# Patient Record
Sex: Female | Born: 1965 | Race: Black or African American | Hispanic: No | Marital: Married | Smoking: Never smoker
Health system: Southern US, Community
[De-identification: ages and names within clinical notes are randomized; demographics above are authoritative.]

## PROBLEM LIST (undated history)

## (undated) DIAGNOSIS — E669 Obesity, unspecified: Secondary | ICD-10-CM

## (undated) DIAGNOSIS — T7840XA Allergy, unspecified, initial encounter: Secondary | ICD-10-CM

## (undated) HISTORY — DX: Obesity, unspecified: E66.9

## (undated) HISTORY — PX: NO PAST SURGERIES: SHX2092

## (undated) HISTORY — DX: Allergy, unspecified, initial encounter: T78.40XA

---

## 1999-08-02 ENCOUNTER — Other Ambulatory Visit: Admission: RE | Admit: 1999-08-02 | Discharge: 1999-08-02 | Payer: Self-pay | Admitting: Obstetrics & Gynecology

## 2000-08-15 ENCOUNTER — Other Ambulatory Visit: Admission: RE | Admit: 2000-08-15 | Discharge: 2000-08-15 | Payer: Self-pay | Admitting: Obstetrics & Gynecology

## 2001-07-11 ENCOUNTER — Other Ambulatory Visit: Admission: RE | Admit: 2001-07-11 | Discharge: 2001-07-11 | Payer: Self-pay | Admitting: Obstetrics and Gynecology

## 2001-08-06 ENCOUNTER — Encounter: Payer: Self-pay | Admitting: Obstetrics and Gynecology

## 2001-08-06 ENCOUNTER — Ambulatory Visit (HOSPITAL_COMMUNITY): Admission: RE | Admit: 2001-08-06 | Discharge: 2001-08-06 | Payer: Self-pay | Admitting: Obstetrics and Gynecology

## 2002-07-15 ENCOUNTER — Other Ambulatory Visit: Admission: RE | Admit: 2002-07-15 | Discharge: 2002-07-15 | Payer: Self-pay | Admitting: Obstetrics and Gynecology

## 2003-07-21 ENCOUNTER — Other Ambulatory Visit: Admission: RE | Admit: 2003-07-21 | Discharge: 2003-07-21 | Payer: Self-pay | Admitting: Obstetrics and Gynecology

## 2003-08-21 ENCOUNTER — Encounter: Payer: Self-pay | Admitting: Obstetrics and Gynecology

## 2003-08-21 ENCOUNTER — Ambulatory Visit (HOSPITAL_COMMUNITY): Admission: RE | Admit: 2003-08-21 | Discharge: 2003-08-21 | Payer: Self-pay | Admitting: Obstetrics and Gynecology

## 2003-08-24 ENCOUNTER — Ambulatory Visit (HOSPITAL_COMMUNITY): Admission: RE | Admit: 2003-08-24 | Discharge: 2003-08-24 | Payer: Self-pay | Admitting: Obstetrics and Gynecology

## 2003-08-24 ENCOUNTER — Encounter: Payer: Self-pay | Admitting: Obstetrics and Gynecology

## 2004-07-27 ENCOUNTER — Other Ambulatory Visit: Admission: RE | Admit: 2004-07-27 | Discharge: 2004-07-27 | Payer: Self-pay | Admitting: Obstetrics and Gynecology

## 2004-09-15 ENCOUNTER — Ambulatory Visit (HOSPITAL_COMMUNITY): Admission: RE | Admit: 2004-09-15 | Discharge: 2004-09-15 | Payer: Self-pay | Admitting: Obstetrics and Gynecology

## 2004-10-13 ENCOUNTER — Ambulatory Visit (HOSPITAL_COMMUNITY): Admission: RE | Admit: 2004-10-13 | Discharge: 2004-10-13 | Payer: Self-pay | Admitting: Obstetrics and Gynecology

## 2004-12-08 ENCOUNTER — Ambulatory Visit (HOSPITAL_COMMUNITY): Admission: RE | Admit: 2004-12-08 | Discharge: 2004-12-08 | Payer: Self-pay | Admitting: Obstetrics and Gynecology

## 2005-02-08 ENCOUNTER — Inpatient Hospital Stay (HOSPITAL_COMMUNITY): Admission: AD | Admit: 2005-02-08 | Discharge: 2005-02-10 | Payer: Self-pay | Admitting: Obstetrics and Gynecology

## 2005-07-13 ENCOUNTER — Other Ambulatory Visit: Admission: RE | Admit: 2005-07-13 | Discharge: 2005-07-13 | Payer: Self-pay | Admitting: Obstetrics and Gynecology

## 2006-05-23 ENCOUNTER — Other Ambulatory Visit: Admission: RE | Admit: 2006-05-23 | Discharge: 2006-05-23 | Payer: Self-pay | Admitting: Obstetrics and Gynecology

## 2008-08-17 ENCOUNTER — Encounter: Admission: RE | Admit: 2008-08-17 | Discharge: 2008-08-17 | Payer: Self-pay | Admitting: Obstetrics and Gynecology

## 2009-09-07 ENCOUNTER — Encounter: Admission: RE | Admit: 2009-09-07 | Discharge: 2009-09-07 | Payer: Self-pay | Admitting: Obstetrics and Gynecology

## 2010-12-24 ENCOUNTER — Encounter: Payer: Self-pay | Admitting: Obstetrics and Gynecology

## 2011-03-16 ENCOUNTER — Encounter: Payer: Self-pay | Admitting: Family Medicine

## 2011-03-16 ENCOUNTER — Ambulatory Visit (INDEPENDENT_AMBULATORY_CARE_PROVIDER_SITE_OTHER): Payer: 59 | Admitting: Family Medicine

## 2011-03-16 VITALS — BP 122/80 | HR 76 | Temp 98.4°F | Ht 63.5 in | Wt 262.4 lb

## 2011-03-16 DIAGNOSIS — Z136 Encounter for screening for cardiovascular disorders: Secondary | ICD-10-CM

## 2011-03-16 DIAGNOSIS — Z0000 Encounter for general adult medical examination without abnormal findings: Secondary | ICD-10-CM | POA: Insufficient documentation

## 2011-03-16 DIAGNOSIS — R079 Chest pain, unspecified: Secondary | ICD-10-CM | POA: Insufficient documentation

## 2011-03-16 LAB — CBC WITH DIFFERENTIAL/PLATELET
Basophils Absolute: 0.1 10*3/uL (ref 0.0–0.1)
Basophils Relative: 0.5 % (ref 0.0–3.0)
Eosinophils Absolute: 0.4 10*3/uL (ref 0.0–0.7)
Eosinophils Relative: 3.6 % (ref 0.0–5.0)
HCT: 38 % (ref 36.0–46.0)
Hemoglobin: 12.6 g/dL (ref 12.0–15.0)
Lymphocytes Relative: 20.7 % (ref 12.0–46.0)
Lymphs Abs: 2.2 10*3/uL (ref 0.7–4.0)
MCHC: 33.2 g/dL (ref 30.0–36.0)
MCV: 83.9 fl (ref 78.0–100.0)
Monocytes Absolute: 1.1 10*3/uL — ABNORMAL HIGH (ref 0.1–1.0)
Monocytes Relative: 10.3 % (ref 3.0–12.0)
Neutro Abs: 6.9 10*3/uL (ref 1.4–7.7)
Neutrophils Relative %: 64.9 % (ref 43.0–77.0)
Platelets: 318 10*3/uL (ref 150.0–400.0)
RBC: 4.53 Mil/uL (ref 3.87–5.11)
RDW: 14.4 % (ref 11.5–14.6)
WBC: 10.6 10*3/uL — ABNORMAL HIGH (ref 4.5–10.5)

## 2011-03-16 LAB — LIPID PANEL
Cholesterol: 189 mg/dL (ref 0–200)
HDL: 36 mg/dL — ABNORMAL LOW (ref >39.00)
LDL Cholesterol: 138 mg/dL — ABNORMAL HIGH (ref 0–99)
Total CHOL/HDL Ratio: 5
Triglycerides: 77 mg/dL (ref 0.0–149.0)
VLDL: 15.4 mg/dL (ref 0.0–40.0)

## 2011-03-16 LAB — BASIC METABOLIC PANEL
BUN: 9 mg/dL (ref 6–23)
CO2: 28 mEq/L (ref 19–32)
Calcium: 9 mg/dL (ref 8.4–10.5)
Chloride: 100 mEq/L (ref 96–112)
Creatinine, Ser: 0.8 mg/dL (ref 0.4–1.2)
GFR: 104.83 mL/min (ref >60.00)
Glucose, Bld: 85 mg/dL (ref 70–99)
Potassium: 4.1 mEq/L (ref 3.5–5.1)
Sodium: 135 mEq/L (ref 135–145)

## 2011-03-16 LAB — TSH: TSH: 2.43 u[IU]/mL (ref 0.35–5.50)

## 2011-03-16 NOTE — Progress Notes (Signed)
45 yo here to establish care and for CPX.  G1P1, has OBGYN and uptodate on prevention (pap and mammogram). Has an mirena IUD.  Left sided chest pain.  On and off for several months.  Feels like a "twinge."  Not reproducible with palpation but is relieved when she wears more supportive bras.  Pain can occur at rest or with exertion but seems to feel it more frequently when standing. No associated palpitations, nausea, diaphoresis. No fatigue. No SOB.  The PMH, PSH, Social History, Family History, Medications, and allergies have been reviewed in [facility], and have been updated if relevant.  ROS: See HPI General: Denies fever, chills, sweats. No significant weight loss. Eyes: Denies blurring,significant itching ENT: Denies earache, sore throat, and hoarseness.  Cardiovascular: Denies palpitations, dyspnea on exertion,  Respiratory: Denies cough, dyspnea at rest,wheeezing Breast: no concerns about lumps GI: Denies nausea, vomiting, diarrhea, constipation, change in bowel habits, abdominal pain, melena, hematochezia GU: Denies dysuria, hematuria, urinary hesitancy, nocturia, denies STD risk, no concerns about discharge Musculoskeletal: Denies back pain, joint pain Derm: Denies rash, itching Neuro: Denies  paresthesias, frequent falls, frequent headaches Psych: Denies depression, anxiety Endocrine: Denies cold intolerance, heat intolerance, polydipsia Heme: Denies enlarged lymph nodes  Physical exam: BP 122/80  Pulse 76  Temp(Src) 98.4 F (36.9 C) (Oral)  Ht 5' 3.5" (1.613 m)  Wt 262 lb 6.4 oz (119.024 kg)  BMI 45.75 kg/m2  General:  Pleasant, overweight female,in no acute distress; alert,appropriate and cooperative throughout examination Head:  normocephalic and atraumatic.   Eyes:  vision grossly intact, pupils equal, pupils round, and pupils reactive to light.   Ears:  R ear normal and L ear normal.   Nose:  no external deformity.   Mouth:  good dentition.   Neck:  No deformities,  masses, or tenderness noted. Breasts:  No mass, nodules, thickening, tenderness, bulging, retraction, inflamation, nipple discharge or skin changes noted.   Lungs:  Normal respiratory effort, chest expands symmetrically. Lungs are clear to auscultation, no crackles or wheezes. Heart:  Normal rate and regular rhythm. S1 and S2 normal without gallop, murmur, click, rub or other extra sounds. Abdomen:  Bowel sounds positive,abdomen soft and non-tender without masses, organomegaly or hernias noted. Msk:  No deformity or scoliosis noted of thoracic or lumbar spine.   CP not reproducible Extremities:  No clubbing, cyanosis, edema, or deformity noted with normal full range of motion of all joints.   Neurologic:  alert & oriented X3 and gait normal.   Skin:  Intact without suspicious lesions or rashes Cervical Nodes:  No lymphadenopathy noted Axillary Nodes:  No palpable lymphadenopathy Psych:  Cognition and judgment appear intact. Alert and cooperative with normal attention span and concentration. No apparent delusions, illusions, hallucinations

## 2011-03-16 NOTE — Assessment & Plan Note (Signed)
Likely MSK.  Does not seem cardiac, no family history of premature CAD. EKG showed low voltage,otherwise NSR. CBC, TSH today. Will refer for stress test if symptoms progress. The patient indicates understanding of these issues and agrees with the plan.

## 2011-03-16 NOTE — Assessment & Plan Note (Signed)
Reviewed preventive care protocols, scheduled due services, and updated immunizations Discussed nutrition, exercise, diet, and healthy lifestyle.  FLP, BMET today.

## 2011-03-16 NOTE — Progress Notes (Signed)
Addended by: Ruthe Mannan on: 03/16/2011 01:16 PM   Modules accepted: Level of Service

## 2011-04-21 NOTE — H&P (Signed)
NAME:  BAYLEI, SIEBELS NO.:  [id number]   MEDICAL RECORD NO.:  [id number]          PATIENT TYPE:  INP   LOCATION:  9160                          FACILITY:  [facility]   PHYSICIAN:  Janine Limbo, M.D.DATE OF BIRTH:  [birth year]   DATE OF ADMISSION:  02/08/2005  DATE OF DISCHARGE:                                HISTORY & PHYSICAL   Ms. Broberg is a 45 year old, married, black female, gravida 2, para 0-0-1-0,  at 38/6/7th's weeks who presents complaining of leaking fluid since about  11:30 this morning.  She was evaluated earlier today at the office and her  exam for ruling out ruptured membranes was equivocal where upon she was sent  to Maternity Admissions for further evaluation.  She was also noted to have  some blood pressure elevation of 130s over 80s at the office and also had a  PIH labs collected in Maternity Admissions.  Those are all normal.  She had  several large gushes of clear amniotic fluid in Maternity Admissions to  confirm the diagnosis of ruptured membranes.  She also subsequently  increased in uterine contraction frequency and strength and is now  complaining of contractions every two to three minutes and rating them as 7  to 8 out of 10.  She also reports positive fetal movement.  She denies any  nausea, vomiting, headaches, or visual disturbances.   Her pregnancy has been followed at [facility]  by the Certified  Nurse Midwife Service and has been:  1.  Essentially uncomplicated, though at risk for a history of advanced      maternal age, declining amnio.  2.  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  3.  A history of laser surgery on her cervix.   Her Group B strep is negative.   OBSTETRICAL AND GYNECOLOGICAL HISTORY:  1.  She is gravida 2, para 0-0-1-0, with an elective AB back in 1990 without      complication.  2.  Her LMP for this pregnancy was May 13, 2004, giving her an [facility] of February 17, 2005, confirmed by early ultrasound.  3.  Abnormal Pap, treated with laser surgery in 1991, and her Pap smears      have been normal since then.  4.  She also had an HSG that was diagnosed.  5.  Her right fallopian tube was blocked.   GENERAL MEDICAL HISTORY:  1.  She has no known drug allergies.  2.  She reports having had the usual childhood diseases.  3.  She has no other medical issues.  4.  She has an occasional urinary tract infection.  5.  Her only surgery was wisdom teeth removed at age 57.   FAMILY HISTORY:  Significant for an uncle with an MI.  Mom and aunts have  hypertension.  Aunt with varicose veins.  An aunt that died from  tuberculosis.  Mother with borderline diabetes.  Grandfather with epilepsy  and father with lung cancer.   GENETIC HISTORY:  Significant only for the  fact that she is over age 3 and  declined an amniocentesis.   SOCIAL HISTORY:  She is married to [employer] who is involved and  supportive.  They are both employed full time.  They deny any illicit drug  use, alcohol, or smoking with this pregnancy.   PRENATAL LABS:  Her blood type is O positive.  Her antibody screen is  negative.  Sickle cell trait is not currently known.  Her syphilis is  immune.  Rubella was not listed.  Hepatitis was negative.  HIV is  nonreactive.  Cystic fibrosis is negative.  Her 36-week beta strep was  negative.  Gonorrhea and Chlamydia were declined.  Her one hour Glucola was  118.   PHYSICAL EXAMINATION:  VITAL SIGNS:  In the 130s over 70s to 80s.  She is  afebrile.  HEENT:  Grossly within normal limits.  HEART:  Regular rhythm and rate.  BREASTS:  Soft and nontender.  ABDOMEN:  Gravid with uterine contractions every two to three minutes.  Her  fetal heart rate is reactive and reassuring with a baseline between 110 and  120.  PELVIC:  Her cervix is 4-cm, 100%, vertex, -1 to -2, and clear fluid noted.  EXTREMITIES:  Within normal limits.   ASSESSMENT:  1.   Intrauterine pregnancy at 38-6/7th's weeks.  2.  Spontaneous rupture of membranes with clear fluid at 11:30 a.m.  3.  Early active labor.   PLAN:  1.  Admit to labor and delivery.  2.  Follow routine CNM orders.  3.  Dr. Kirkland Hun is notified of the patient's admission.      SJD/MEDQ  D:  02/08/2005  T:  02/08/2005  Job:  [id number]

## 2011-10-16 ENCOUNTER — Telehealth: Payer: Self-pay | Admitting: Family Medicine

## 2011-10-16 NOTE — Telephone Encounter (Signed)
Triage Record Num: [id number] Operator: Griselda Miner Patient Name: Tracie Joyce Call Date & Time: 10/14/2011 10:56:27AM Patient Phone: [phone] PCP: Ruthe Mannan Patient Gender: Female PCP Fax : [phone] Patient DOB: 1966/07/26 Practice Name: [facility] Reason for Call: Pt calls with complaints of persistent cough of 4 to f days-- onset 10/09/11. Cough intensity has increased since 10/12/11. Patient has some cold symptoms nose running --clear, eyes are puffy, nose stuffy, some body ached that patient feels is related to the coughing. Started with tickle and now feels like in chest but cough is non-productive. Afebrile but did note low grade oral temp of 99 on 10/10/11. Does have a Mirena IUD-not breastfeeding. Dispostion: Home/Self Care advice given. Appt Scheduled: NO Protocol(s) Used: Cough - Adult Recommended Outcome per Protocol: Provide Home/Self Care Reason for Outcome: New onset of two or more of the following symptoms: nasal congestion with runny nose; sneezing; itchy or mild sore throat; mild headache or body aches; mild fatigue; low grade fever up to 101.5 F (38.6C) usually lasting about a week Care Advice: ~ Use a cool mist humidifier to moisten air. Be sure to clean according to manufacturer's instructions. ~ Rest until symptoms improve. Handwashing and avoiding finger-to-nose, finger-to-mouth, or finger-to-eye contact after exposure may reduce risk of transmission. ~ Call provider if difficulty breathing or wheezing develops or if cough becomes productive of green, yellow or brown sputum ~ During pregnancy, call provider if temperature is 100 F (37.7 C) or greater OR any temperature elevation for 3 days even while taking acetaminophen. ~ For a fever during pregnancy, take temperature every 4 hours while awake, or more often with chills/sweats, until temperature returns to normal for 24 hours. ~ ~ Consider use of a saline nasal spray per package directions  to help relieve nasal congestion. An annual influenza vaccination is recommended for all adults 63 years of age or older, those with chronic illness, or in contact with high risk individuals. An immunization for pneumonia is also recommended. The frail elderly may require a second pneumonia immunization 3 to 5 years after the first dose. ~ ~ Cover mouth and nose tightly with tissue when sneezing or coughing, or cough into your elbow. Over-the-counter cough medicines are not recommended to treat a cough. An antihistamine in combination with a decongestant may help relieve a postnasal drip that causes the cough. Check all ingredients before taking cold remedies as many are combinations and when taken with another single ingredient medicine may mean you are overdosing with that medicine. If you have high blood pressure, consult pharmacist or provider before taking a decongestant alone or in combination with another drug. ~ ~ Call provider for evaluation of cough that lasts 2 weeks or more. ~ HEALTH PROMOTION / MAINTENANCE ~ SYMPTOM / CONDITION MANAGEMENT ~ INFECTION CONTROL ~ CAUTIONS ~ Analgesic/Antipyretic Advice - Acetaminophen:

## 2011-12-27 ENCOUNTER — Other Ambulatory Visit: Payer: Self-pay | Admitting: Obstetrics and Gynecology

## 2011-12-27 DIAGNOSIS — Z1231 Encounter for screening mammogram for malignant neoplasm of breast: Secondary | ICD-10-CM

## 2012-01-06 ENCOUNTER — Encounter: Payer: Self-pay | Admitting: Obstetrics and Gynecology

## 2012-01-11 ENCOUNTER — Ambulatory Visit
Admission: RE | Admit: 2012-01-11 | Discharge: 2012-01-11 | Disposition: A | Discharge status: Home / Self Care | Payer: 59 | Source: Ambulatory Visit | Attending: Obstetrics and Gynecology | Admitting: Obstetrics and Gynecology

## 2012-01-11 DIAGNOSIS — Z1231 Encounter for screening mammogram for malignant neoplasm of breast: Secondary | ICD-10-CM

## 2012-01-15 ENCOUNTER — Encounter: Payer: Self-pay | Admitting: *Deleted

## 2013-02-12 ENCOUNTER — Other Ambulatory Visit: Payer: Self-pay

## 2013-02-27 ENCOUNTER — Other Ambulatory Visit: Payer: Self-pay | Admitting: Obstetrics and Gynecology

## 2013-02-28 LAB — PAP IG W/ RFLX HPV ASCU

## 2013-03-19 ENCOUNTER — Ambulatory Visit
Admission: RE | Admit: 2013-03-19 | Discharge: 2013-03-19 | Disposition: A | Discharge status: Home / Self Care | Payer: 59 | Source: Ambulatory Visit

## 2013-03-19 DIAGNOSIS — Z1231 Encounter for screening mammogram for malignant neoplasm of breast: Secondary | ICD-10-CM

## 2013-03-24 ENCOUNTER — Other Ambulatory Visit: Payer: Self-pay | Admitting: Obstetrics and Gynecology

## 2013-03-24 DIAGNOSIS — R928 Other abnormal and inconclusive findings on diagnostic imaging of breast: Secondary | ICD-10-CM

## 2013-03-31 ENCOUNTER — Ambulatory Visit
Admission: RE | Admit: 2013-03-31 | Discharge: 2013-03-31 | Disposition: A | Discharge status: Home / Self Care | Payer: 59 | Source: Ambulatory Visit | Attending: Obstetrics and Gynecology | Admitting: Obstetrics and Gynecology

## 2013-03-31 DIAGNOSIS — R928 Other abnormal and inconclusive findings on diagnostic imaging of breast: Secondary | ICD-10-CM

## 2014-02-17 ENCOUNTER — Other Ambulatory Visit: Payer: Self-pay | Admitting: Obstetrics and Gynecology

## 2014-02-17 DIAGNOSIS — N63 Unspecified lump in unspecified breast: Secondary | ICD-10-CM

## 2014-04-01 ENCOUNTER — Ambulatory Visit
Admission: RE | Admit: 2014-04-01 | Discharge: 2014-04-01 | Disposition: A | Discharge status: Home / Self Care | Payer: 59 | Source: Ambulatory Visit | Attending: Obstetrics and Gynecology | Admitting: Obstetrics and Gynecology

## 2014-04-01 DIAGNOSIS — N63 Unspecified lump in unspecified breast: Secondary | ICD-10-CM

## 2015-04-12 ENCOUNTER — Other Ambulatory Visit: Payer: Self-pay | Admitting: Obstetrics and Gynecology

## 2015-04-12 DIAGNOSIS — N63 Unspecified lump in unspecified breast: Secondary | ICD-10-CM

## 2015-04-15 ENCOUNTER — Ambulatory Visit
Admission: RE | Admit: 2015-04-15 | Discharge: 2015-04-15 | Disposition: A | Discharge status: Home / Self Care | Payer: 59 | Source: Ambulatory Visit | Attending: Obstetrics and Gynecology | Admitting: Obstetrics and Gynecology

## 2015-04-15 DIAGNOSIS — N63 Unspecified lump in unspecified breast: Secondary | ICD-10-CM

## 2016-06-14 ENCOUNTER — Other Ambulatory Visit: Payer: Self-pay | Admitting: Obstetrics and Gynecology

## 2016-06-14 DIAGNOSIS — Z1231 Encounter for screening mammogram for malignant neoplasm of breast: Secondary | ICD-10-CM

## 2016-06-21 ENCOUNTER — Ambulatory Visit
Admission: RE | Admit: 2016-06-21 | Discharge: 2016-06-21 | Disposition: A | Discharge status: Home / Self Care | Payer: 59 | Source: Ambulatory Visit | Attending: Obstetrics and Gynecology | Admitting: Obstetrics and Gynecology

## 2016-06-21 DIAGNOSIS — Z1231 Encounter for screening mammogram for malignant neoplasm of breast: Secondary | ICD-10-CM

## 2017-10-17 ENCOUNTER — Other Ambulatory Visit: Payer: Self-pay | Admitting: Obstetrics and Gynecology

## 2017-10-17 DIAGNOSIS — Z139 Encounter for screening, unspecified: Secondary | ICD-10-CM

## 2017-11-14 ENCOUNTER — Ambulatory Visit
Admission: RE | Admit: 2017-11-14 | Discharge: 2017-11-14 | Disposition: A | Discharge status: Home / Self Care | Payer: 59 | Source: Ambulatory Visit | Attending: Obstetrics and Gynecology | Admitting: Obstetrics and Gynecology

## 2017-11-14 DIAGNOSIS — Z139 Encounter for screening, unspecified: Secondary | ICD-10-CM

## 2017-11-15 ENCOUNTER — Other Ambulatory Visit: Payer: Self-pay | Admitting: Obstetrics and Gynecology

## 2017-11-15 DIAGNOSIS — R928 Other abnormal and inconclusive findings on diagnostic imaging of breast: Secondary | ICD-10-CM

## 2017-11-20 ENCOUNTER — Other Ambulatory Visit: Payer: Self-pay | Admitting: Obstetrics and Gynecology

## 2017-11-20 ENCOUNTER — Ambulatory Visit
Admission: RE | Admit: 2017-11-20 | Discharge: 2017-11-20 | Disposition: A | Discharge status: Home / Self Care | Payer: 59 | Source: Ambulatory Visit | Attending: Obstetrics and Gynecology | Admitting: Obstetrics and Gynecology

## 2017-11-20 DIAGNOSIS — R921 Mammographic calcification found on diagnostic imaging of breast: Secondary | ICD-10-CM

## 2017-11-20 DIAGNOSIS — R928 Other abnormal and inconclusive findings on diagnostic imaging of breast: Secondary | ICD-10-CM

## 2017-11-21 ENCOUNTER — Other Ambulatory Visit: Payer: Self-pay | Admitting: Obstetrics and Gynecology

## 2017-11-21 DIAGNOSIS — R921 Mammographic calcification found on diagnostic imaging of breast: Secondary | ICD-10-CM

## 2017-11-21 DIAGNOSIS — N63 Unspecified lump in unspecified breast: Secondary | ICD-10-CM

## 2018-05-28 ENCOUNTER — Ambulatory Visit
Admission: RE | Admit: 2018-05-28 | Discharge: 2018-05-28 | Disposition: A | Discharge status: Home / Self Care | Payer: 59 | Source: Ambulatory Visit | Attending: Obstetrics and Gynecology | Admitting: Obstetrics and Gynecology

## 2018-05-28 ENCOUNTER — Other Ambulatory Visit: Payer: Self-pay | Admitting: Obstetrics and Gynecology

## 2018-05-28 DIAGNOSIS — N63 Unspecified lump in unspecified breast: Secondary | ICD-10-CM

## 2018-05-28 DIAGNOSIS — R921 Mammographic calcification found on diagnostic imaging of breast: Secondary | ICD-10-CM

## 2018-12-09 ENCOUNTER — Other Ambulatory Visit: Payer: 59

## 2018-12-16 ENCOUNTER — Ambulatory Visit
Admission: RE | Admit: 2018-12-16 | Discharge: 2018-12-16 | Disposition: A | Discharge status: Home / Self Care | Payer: PRIVATE HEALTH INSURANCE | Source: Ambulatory Visit | Attending: Obstetrics and Gynecology | Admitting: Obstetrics and Gynecology

## 2018-12-16 DIAGNOSIS — N63 Unspecified lump in unspecified breast: Secondary | ICD-10-CM

## 2019-12-31 ENCOUNTER — Other Ambulatory Visit: Payer: Self-pay | Admitting: Obstetrics and Gynecology

## 2019-12-31 DIAGNOSIS — N6320 Unspecified lump in the left breast, unspecified quadrant: Secondary | ICD-10-CM

## 2020-01-20 ENCOUNTER — Ambulatory Visit
Admission: RE | Admit: 2020-01-20 | Discharge: 2020-01-20 | Disposition: A | Discharge status: Home / Self Care | Payer: PRIVATE HEALTH INSURANCE | Source: Ambulatory Visit | Attending: Obstetrics and Gynecology | Admitting: Obstetrics and Gynecology

## 2020-01-20 ENCOUNTER — Other Ambulatory Visit: Payer: Self-pay

## 2020-01-20 ENCOUNTER — Other Ambulatory Visit: Payer: Self-pay | Admitting: Obstetrics and Gynecology

## 2020-01-20 DIAGNOSIS — N6310 Unspecified lump in the right breast, unspecified quadrant: Secondary | ICD-10-CM

## 2020-01-20 DIAGNOSIS — N6320 Unspecified lump in the left breast, unspecified quadrant: Secondary | ICD-10-CM

## 2020-12-06 ENCOUNTER — Other Ambulatory Visit: Payer: PRIVATE HEALTH INSURANCE

## 2020-12-06 DIAGNOSIS — Z20822 Contact with and (suspected) exposure to covid-19: Secondary | ICD-10-CM

## 2020-12-07 LAB — SARS-COV-2, NAA 2 DAY TAT

## 2020-12-07 LAB — NOVEL CORONAVIRUS, NAA: SARS-CoV-2, NAA: NOT DETECTED

## 2021-02-01 ENCOUNTER — Other Ambulatory Visit: Payer: Self-pay | Admitting: Obstetrics and Gynecology

## 2021-02-01 DIAGNOSIS — Z1231 Encounter for screening mammogram for malignant neoplasm of breast: Secondary | ICD-10-CM

## 2021-03-28 ENCOUNTER — Other Ambulatory Visit: Payer: Self-pay

## 2021-03-28 ENCOUNTER — Ambulatory Visit
Admission: RE | Admit: 2021-03-28 | Discharge: 2021-03-28 | Disposition: A | Discharge status: Home / Self Care | Payer: PRIVATE HEALTH INSURANCE | Source: Ambulatory Visit | Attending: Obstetrics and Gynecology | Admitting: Obstetrics and Gynecology

## 2021-03-28 DIAGNOSIS — Z1231 Encounter for screening mammogram for malignant neoplasm of breast: Secondary | ICD-10-CM

## 2022-05-02 ENCOUNTER — Encounter: Payer: Self-pay | Admitting: Family

## 2022-05-02 ENCOUNTER — Ambulatory Visit (INDEPENDENT_AMBULATORY_CARE_PROVIDER_SITE_OTHER): Payer: PRIVATE HEALTH INSURANCE | Admitting: Family

## 2022-05-02 VITALS — BP 118/76 | HR 84 | Temp 98.0°F | Resp 16 | Ht 63.0 in | Wt 266.1 lb

## 2022-05-02 DIAGNOSIS — Z1211 Encounter for screening for malignant neoplasm of colon: Secondary | ICD-10-CM | POA: Diagnosis not present

## 2022-05-02 DIAGNOSIS — H04123 Dry eye syndrome of bilateral lacrimal glands: Secondary | ICD-10-CM

## 2022-05-02 DIAGNOSIS — Z1231 Encounter for screening mammogram for malignant neoplasm of breast: Secondary | ICD-10-CM | POA: Insufficient documentation

## 2022-05-02 DIAGNOSIS — Z13220 Encounter for screening for lipoid disorders: Secondary | ICD-10-CM | POA: Insufficient documentation

## 2022-05-02 DIAGNOSIS — Z23 Encounter for immunization: Secondary | ICD-10-CM

## 2022-05-02 DIAGNOSIS — Z0000 Encounter for general adult medical examination without abnormal findings: Secondary | ICD-10-CM | POA: Insufficient documentation

## 2022-05-02 LAB — COMPREHENSIVE METABOLIC PANEL
ALT: 20 U/L (ref 0–35)
AST: 19 U/L (ref 0–37)
Albumin: 4.2 g/dL (ref 3.5–5.2)
Alkaline Phosphatase: 80 U/L (ref 39–117)
BUN: 16 mg/dL (ref 6–23)
CO2: 27 mEq/L (ref 19–32)
Calcium: 9.6 mg/dL (ref 8.4–10.5)
Chloride: 102 mEq/L (ref 96–112)
Creatinine, Ser: 0.83 mg/dL (ref 0.40–1.20)
GFR: 79.64 mL/min (ref >60.00)
Glucose, Bld: 96 mg/dL (ref 70–99)
Potassium: 3.6 mEq/L (ref 3.5–5.1)
Sodium: 136 mEq/L (ref 135–145)
Total Bilirubin: 0.6 mg/dL (ref 0.2–1.2)
Total Protein: 8.2 g/dL (ref 6.0–8.3)

## 2022-05-02 LAB — CBC WITH DIFFERENTIAL/PLATELET
Basophils Absolute: 0.1 10*3/uL (ref 0.0–0.1)
Basophils Relative: 0.8 % (ref 0.0–3.0)
Eosinophils Absolute: 0.1 10*3/uL (ref 0.0–0.7)
Eosinophils Relative: 1.3 % (ref 0.0–5.0)
HCT: 38.7 % (ref 36.0–46.0)
Hemoglobin: 12.3 g/dL (ref 12.0–15.0)
Lymphocytes Relative: 34.1 % (ref 12.0–46.0)
Lymphs Abs: 3 10*3/uL (ref 0.7–4.0)
MCHC: 31.8 g/dL (ref 30.0–36.0)
MCV: 82.3 fl (ref 78.0–100.0)
Monocytes Absolute: 0.9 10*3/uL (ref 0.1–1.0)
Monocytes Relative: 10 % (ref 3.0–12.0)
Neutro Abs: 4.7 10*3/uL (ref 1.4–7.7)
Neutrophils Relative %: 53.8 % (ref 43.0–77.0)
Platelets: 321 10*3/uL (ref 150.0–400.0)
RBC: 4.7 Mil/uL (ref 3.87–5.11)
RDW: 15.3 % (ref 11.5–15.5)
WBC: 8.7 10*3/uL (ref 4.0–10.5)

## 2022-05-02 LAB — LIPID PANEL
Cholesterol: 190 mg/dL (ref 0–200)
HDL: 42.3 mg/dL (ref >39.00)
LDL Cholesterol: 130 mg/dL — ABNORMAL HIGH (ref 0–99)
NonHDL: 147.89
Total CHOL/HDL Ratio: 4
Triglycerides: 89 mg/dL (ref 0.0–149.0)
VLDL: 17.8 mg/dL (ref 0.0–40.0)

## 2022-05-02 NOTE — Patient Instructions (Addendum)
Call to schedule appointment for: []   2D Mammogram  [x]   3D Mammogram  []   Bone Density   Your appointment will at the following location  []   [facility]  [place] [address]  [phone]  []   [place] [facility])   [address] Room [place], [place] [address]  [phone]  [x]   [facility]       [place], [place]         []   [facility]  [place], [place]  []  [facility]   [address], [place] [address]  []  [place]  [place] [address] [place], [place] [address] [phone]  Make sure to wear two piece  clothing  No lotions powders or deodorants the day of the appointment Make sure to bring picture ID and insurance card.  Bring list of medications you are currently taking including any supplements.   You should be contacted about your referral for colonoscopy within the next week.    Welcome to our clinic, I am happy to have you as my new patient. I am excited to continue on this healthcare journey with you.  Stop by the lab prior to leaving today. I will notify you of your results once received.   Please keep in mind Any my chart messages you send have up to a three business day turnaround for a response.  Phone calls may take up to a one full business day turnaround for a  response.   If you need a medication refill I recommend you request it through the pharmacy as this is easiest for [place] rather than sending a message and or phone call.   Due to recent changes in healthcare laws, you may see results of your imaging and/or laboratory studies on MyChart before I have had a chance to review them.  I understand that in some cases there may be  results that are confusing or concerning to you. Please understand that not all results are received at the same time and often I may need to interpret multiple results in order to provide you with the best plan of care or course of treatment. Therefore, I ask that you please give me 2 business days to thoroughly review all your results before contacting my office for clarification. Should we see a critical lab result, you will be contacted sooner.   It was a pleasure seeing you today! Please do not hesitate to reach out with any questions and or concerns.  Regards,   Eugenia Pancoast FNP-C

## 2022-05-02 NOTE — Assessment & Plan Note (Signed)
Ordered lipid panel, pending results. Work on low cholesterol diet and exercise as tolerated ? ?

## 2022-05-02 NOTE — Assessment & Plan Note (Signed)
Patient Counseling(The following topics were reviewed):  Preventative care handout given to pt  Health maintenance and immunizations reviewed. Please refer to Health maintenance section. Pt advised on safe sex, wearing seatbelts in car, and proper nutrition labwork ordered today for annual Dental health: Discussed importance of regular tooth brushing, flossing, and dental visits.  Labs today, pending

## 2022-05-02 NOTE — Assessment & Plan Note (Signed)
Referred to GI for colonoscopy 

## 2022-05-02 NOTE — Assessment & Plan Note (Signed)
Mammogram ordered. Pending results. 

## 2022-05-02 NOTE — Progress Notes (Signed)
New Patient Office Visit  Subjective:  Patient ID: Tracie Joyce, female    DOB: 1966-02-18  Age: 56 y.o. MRN: [id number]  CC:  Chief Complaint  Patient presents with   Establish Care    HPI Tracie Joyce is here to establish care as a new patient.  Prior provider was: has been a while.  Pt is without acute concerns.   chronic concerns:  Has mirena in place, Iud. Sees gynecology. Expires next year 2024. Last pap 2023 per pt, thinks in April, negative per pt. Sees [facility], GYN.  Dry eyes: takes rx [place]  , sees opthamologist for this.   Past Medical History:  Diagnosis Date   Allergy    Obesity     Past Surgical History:  Procedure Laterality Date   NO PAST SURGERIES      Family History  Problem Relation Age of Onset   ALS Mother 39   Prostate cancer Father    Stroke Maternal Grandmother    Leukemia Maternal Aunt    Tuberculosis Maternal Aunt    Heart disease Maternal Uncle    Stroke Paternal Aunt     Social History   Socioeconomic History   Marital status: Married    Spouse name: Not on file   Number of children: 1   Years of education: Not on file   Highest education level: Not on file  Occupational History   Occupation: Engineer, civil (consulting)    Employer: [employer]    Comment: works from home  Tobacco Use   Smoking status: Never   Smokeless tobacco: Not on file  Vaping Use   Vaping Use: Never used  Substance and Sexual Activity   Alcohol use: Not Currently   Drug use: Never   Sexual activity: Yes    Partners: Male    Birth control/protection: I.U.D.  Other Topics Concern   Not on file  Social History Narrative   Boy -18   Social Determinants of Health   Financial Resource Strain: Not on file  Food Insecurity: Not on file  Transportation Needs: Not on file  Physical Activity: Not on file  Stress: Not on file  Social Connections: Not on file  Intimate Partner Violence: Not on file    Outpatient Medications Prior to  Visit  Medication Sig Dispense Refill   cycloSPORINE, PF, (CEQUA) 0.09 % SOLN Cequa 0.09 % eye drops in a dropperette  INSTILL 1 DROP INTO EACH EYE INTO EACH EYE TWICE DAILY     levonorgestrel (MIRENA) 20 MCG/DAY IUD Mirena 20 mcg/24 hours (7 yrs) 52 mg intrauterine device     Multiple Vitamin (MULTI-DAY PO) Take by mouth.     Turmeric-Ginger 135-6 MG CHEW Chew by mouth.     levonorgestrel (MIRENA) 20 MCG/24HR IUD 1 each by Intrauterine route once.     No facility-administered medications prior to visit.    No Known Allergies  ROS Review of Systems  Review of Systems  Respiratory:  Negative for shortness of breath.   Cardiovascular:  Negative for chest pain and palpitations.  Gastrointestinal:  Negative for constipation and diarrhea.  Genitourinary:  Negative for dysuria, frequency and urgency.  Musculoskeletal:  Negative for myalgias.  Psychiatric/Behavioral:  Negative for depression and suicidal ideas.   All other systems reviewed and are negative.    Objective:    Physical Exam  Gen: NAD, resting comfortably CV: RRR with no murmurs appreciated Pulm: NWOB, CTAB with no crackles, wheezes, or rhonchi Skin: warm, dry  Psych: Normal affect and thought content  BP 118/76   Pulse 84   Temp 98 F (36.7 C)   Resp 16   Ht 5\' 3"  (1.6 m)   Wt 266 lb 2 oz (120.7 kg)   SpO2 97%   BMI 47.14 kg/m  Wt Readings from Last 3 Encounters:  05/02/22 266 lb 2 oz (120.7 kg)  03/16/11 (!) 262 lb 6.4 oz (119 kg)     Health Maintenance Due  Topic Date Due   TETANUS/TDAP  Never done   COLONOSCOPY (Pts 45-27yrs Insurance coverage will need to be confirmed)  Never done   Zoster Vaccines- Shingrix (1 of 2) Never done   PAP SMEAR-Modifier  10/04/2020   MAMMOGRAM  03/28/2022    There are no preventive care reminders to display for this patient.  Lab Results  Component Value Date   TSH 2.43 03/16/2011   Lab Results  Component Value Date   WBC 10.6 (H) 03/16/2011   HGB 12.6  03/16/2011   HCT 38.0 03/16/2011   MCV 83.9 03/16/2011   PLT 318.0 03/16/2011   Lab Results  Component Value Date   NA 135 03/16/2011   K 4.1 03/16/2011   CO2 28 03/16/2011   GLUCOSE 85 03/16/2011   BUN 9 03/16/2011   CREATININE 0.8 03/16/2011   CALCIUM 9.0 03/16/2011   GFR 104.83 03/16/2011   Lab Results  Component Value Date   CHOL 189 03/16/2011   Lab Results  Component Value Date   HDL 36.00 (L) 03/16/2011   Lab Results  Component Value Date   LDLCALC 138 (H) 03/16/2011   Lab Results  Component Value Date   TRIG 77.0 03/16/2011   Lab Results  Component Value Date   CHOLHDL 5 03/16/2011   No results found for: HGBA1C    Assessment & Plan:   Problem List Items Addressed This Visit       Other   Chronic dryness of both eyes - Primary    Cont f/u with opthamologist       Encounter for general adult medical examination without abnormal findings    Patient Counseling(The following topics were reviewed):  Preventative care handout given to pt  Health maintenance and immunizations reviewed. Please refer to Health maintenance section. Pt advised on safe sex, wearing seatbelts in car, and proper nutrition labwork ordered today for annual Dental health: Discussed importance of regular tooth brushing, flossing, and dental visits.  Labs today, pending      Relevant Orders   Comprehensive metabolic panel   Lipid panel   CBC with Differential/Platelet   Screening for lipoid disorders    Ordered lipid panel, pending results. Work on low cholesterol diet and exercise as tolerated         Relevant Orders   Lipid panel   Screening mammogram for breast cancer    Mammogram ordered. Pending results.        Relevant Orders   MM 3D SCREEN BREAST BILATERAL   Screening for malignant neoplasm of colon    Referred to GI for colonoscopy.          Relevant Orders   Ambulatory referral to Gastroenterology   RESOLVED: Encounter for vaccination    No  orders of the defined types were placed in this encounter.   Follow-up: Return in about 1 year (around 05/03/2023) for regular annual follow up appt.    05/05/2023, FNP

## 2022-05-02 NOTE — Assessment & Plan Note (Signed)
Cont f/u with opthamologist

## 2022-05-10 ENCOUNTER — Ambulatory Visit
Admission: RE | Admit: 2022-05-10 | Discharge: 2022-05-10 | Disposition: A | Discharge status: Home / Self Care | Payer: PRIVATE HEALTH INSURANCE | Source: Ambulatory Visit | Attending: Family | Admitting: Family

## 2022-05-10 DIAGNOSIS — Z1231 Encounter for screening mammogram for malignant neoplasm of breast: Secondary | ICD-10-CM

## 2024-03-13 ENCOUNTER — Other Ambulatory Visit: Payer: Self-pay | Admitting: Obstetrics and Gynecology

## 2024-03-13 DIAGNOSIS — Z1231 Encounter for screening mammogram for malignant neoplasm of breast: Secondary | ICD-10-CM

## 2024-04-03 ENCOUNTER — Ambulatory Visit
Admission: RE | Admit: 2024-04-03 | Discharge: 2024-04-03 | Disposition: A | Discharge status: Home / Self Care | Payer: PRIVATE HEALTH INSURANCE | Source: Ambulatory Visit | Attending: Obstetrics and Gynecology | Admitting: Obstetrics and Gynecology

## 2024-04-03 DIAGNOSIS — Z1231 Encounter for screening mammogram for malignant neoplasm of breast: Secondary | ICD-10-CM

## 2024-05-30 IMAGING — MG MM DIGITAL SCREENING BILAT W/ TOMO AND CAD
6 of 12 series · 6 of 36 positions shown · non-contrast
Comparison: Previous exam(s).

CLINICAL DATA: Screening. Technologist indicates best
positioning/images possible.

EXAM:
DIGITAL SCREENING BILATERAL MAMMOGRAM WITH TOMOSYNTHESIS AND CAD
TECHNIQUE: Bilateral screening digital craniocaudal and mediolateral oblique
mammograms were obtained. Bilateral screening digital breast
tomosynthesis was performed. The images were evaluated with
computer-aided detection.

[R CC synth-2D (1 of 2)]
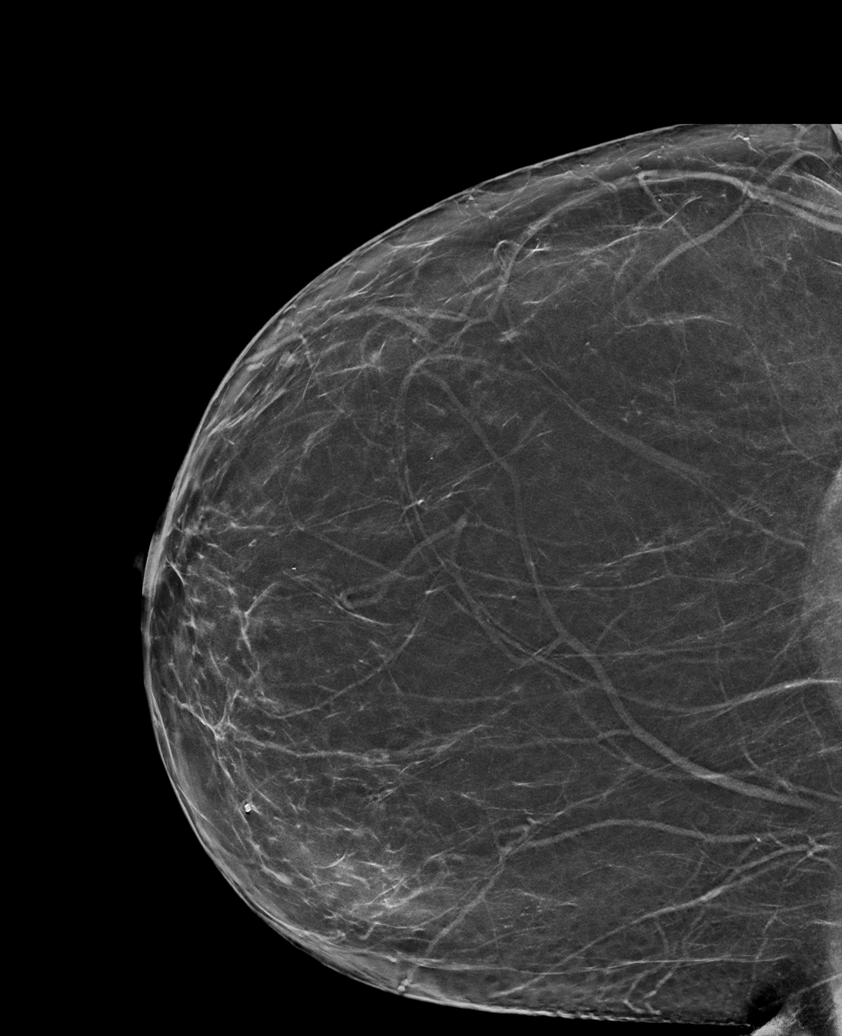

[L CC synth-2D]
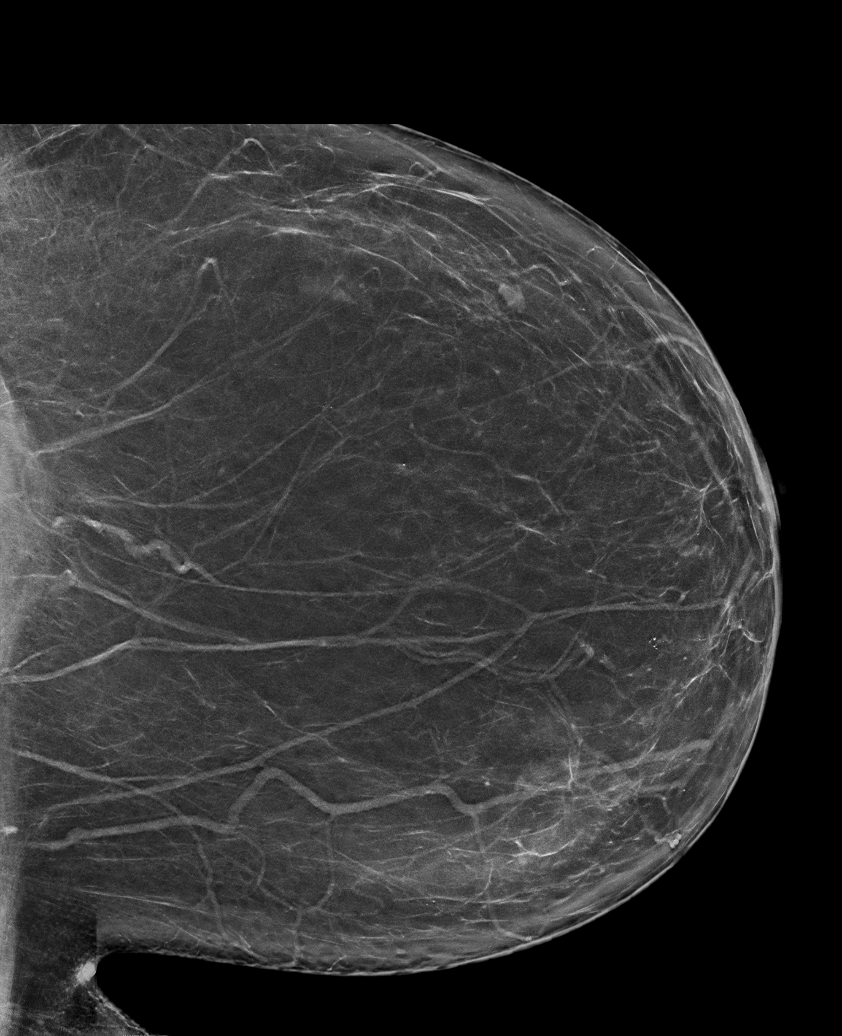

[L MLO synth-2D]
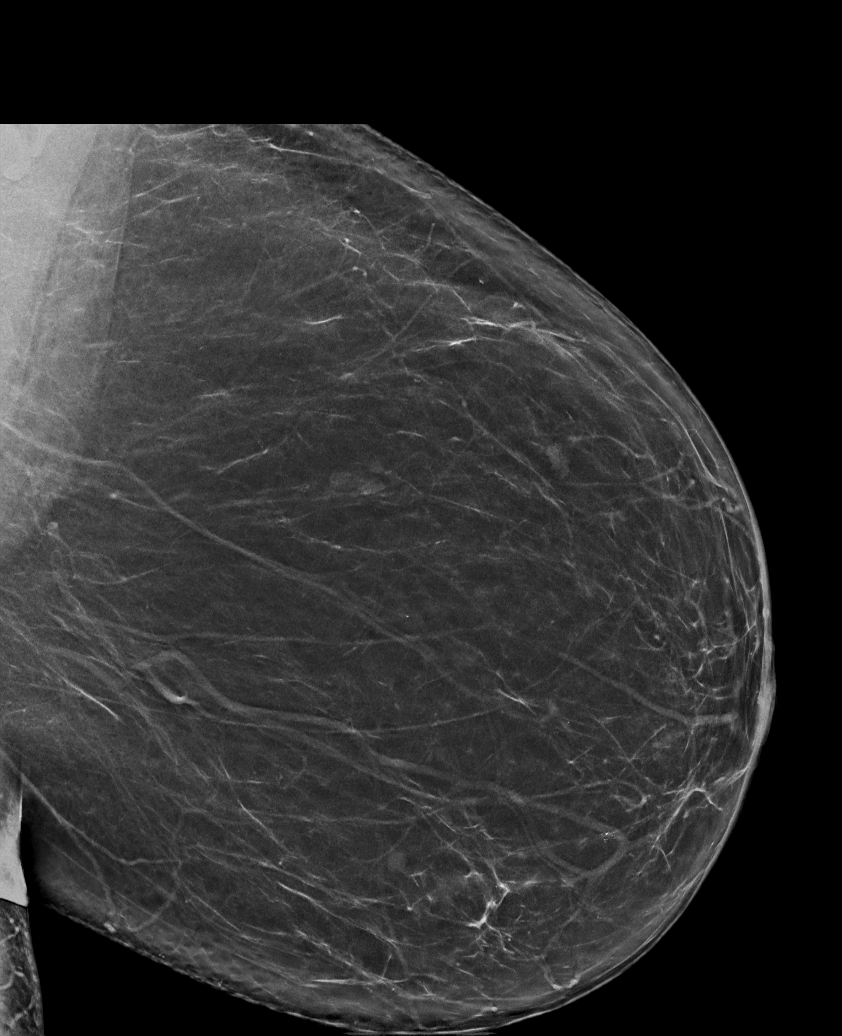

[R CC synth-2D (2 of 2)]
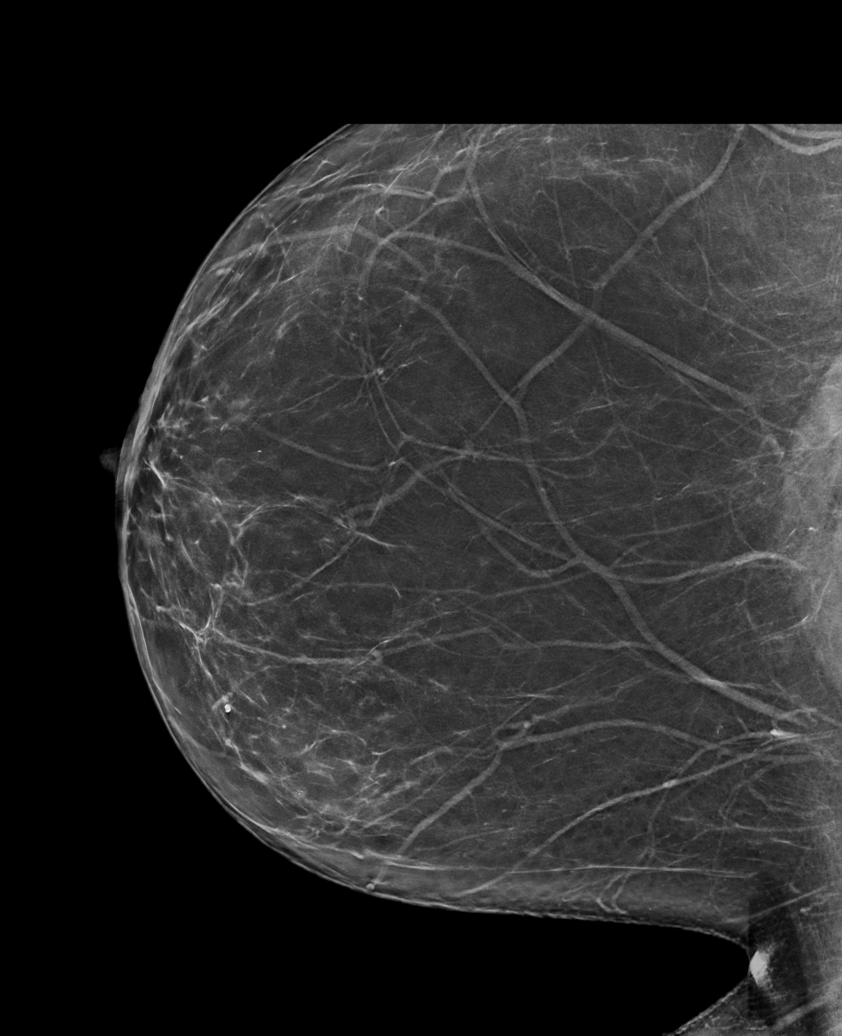

[R MLO synth-2D (1 of 2)]
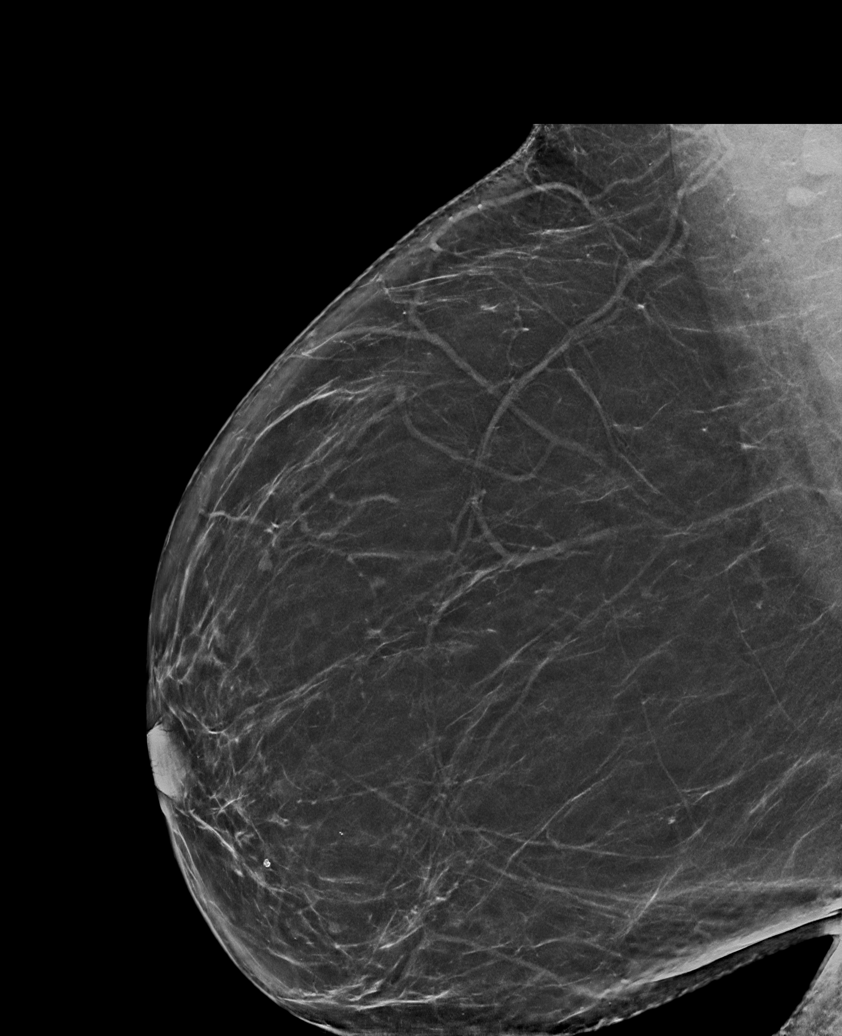

[R MLO synth-2D (2 of 2)]
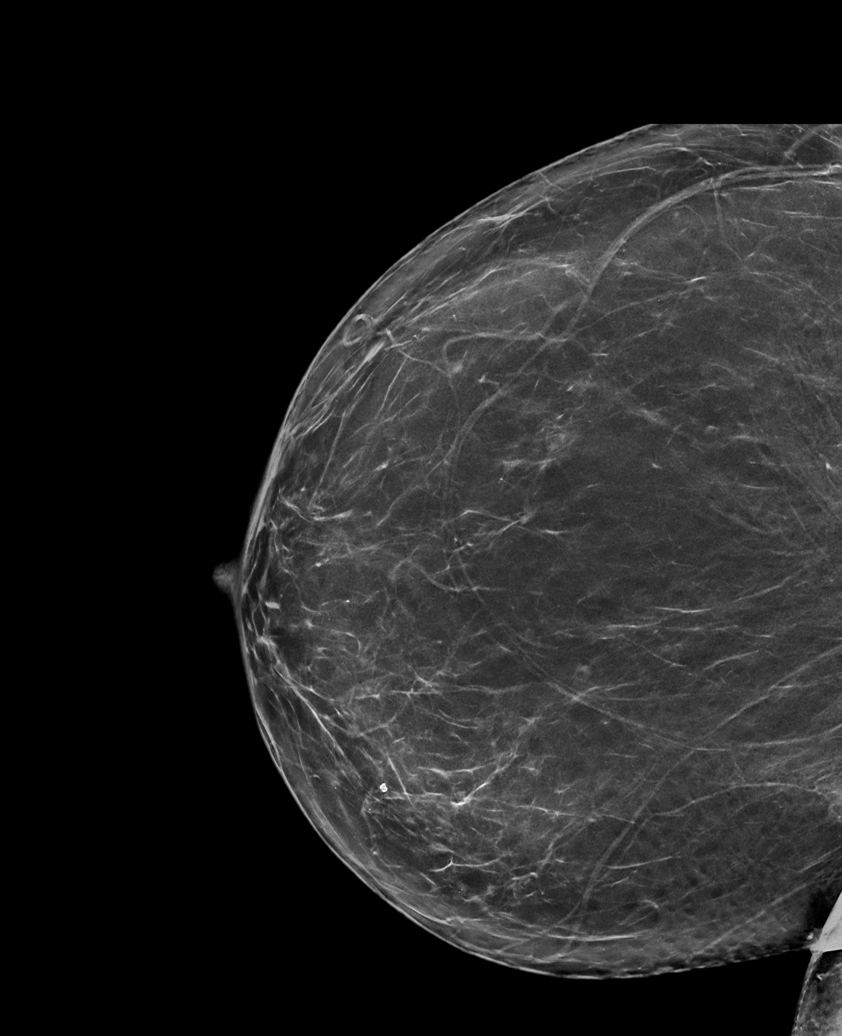

[6 of 36 positions shown; findings below may reference images not displayed]

ACR Breast Density Category b: There are scattered areas of
fibroglandular density.
FINDINGS: There are no findings suspicious for malignancy.
IMPRESSION: No mammographic evidence of malignancy. A result letter of this
screening mammogram will be mailed directly to the patient.

RECOMMENDATION:
Screening mammogram in one year. (Code:1Y-E-[id number])

BI-RADS CATEGORY  1: Negative.

## 2024-07-29 ENCOUNTER — Encounter: Payer: PRIVATE HEALTH INSURANCE | Admitting: Family

## 2024-10-23 ENCOUNTER — Ambulatory Visit (INDEPENDENT_AMBULATORY_CARE_PROVIDER_SITE_OTHER): Payer: PRIVATE HEALTH INSURANCE | Admitting: Family

## 2024-10-23 ENCOUNTER — Ambulatory Visit: Payer: Self-pay | Admitting: Family

## 2024-10-23 ENCOUNTER — Encounter: Payer: Self-pay | Admitting: Family

## 2024-10-23 VITALS — BP 136/86 | HR 94 | Temp 98.2°F | Ht 62.5 in | Wt 274.2 lb

## 2024-10-23 DIAGNOSIS — Z13220 Encounter for screening for lipoid disorders: Secondary | ICD-10-CM | POA: Diagnosis not present

## 2024-10-23 DIAGNOSIS — Z6841 Body Mass Index (BMI) 40.0 and over, adult: Secondary | ICD-10-CM

## 2024-10-23 DIAGNOSIS — Z23 Encounter for immunization: Secondary | ICD-10-CM

## 2024-10-23 DIAGNOSIS — Z1211 Encounter for screening for malignant neoplasm of colon: Secondary | ICD-10-CM

## 2024-10-23 DIAGNOSIS — Z0000 Encounter for general adult medical examination without abnormal findings: Secondary | ICD-10-CM | POA: Diagnosis not present

## 2024-10-23 DIAGNOSIS — E66813 Obesity, class 3: Secondary | ICD-10-CM | POA: Diagnosis not present

## 2024-10-23 LAB — CBC
HCT: 38 % (ref 36.0–46.0)
Hemoglobin: 12.2 g/dL (ref 12.0–15.0)
MCHC: 32.2 g/dL (ref 30.0–36.0)
MCV: 81.6 fl (ref 78.0–100.0)
Platelets: 326 K/uL (ref 150.0–400.0)
RBC: 4.65 Mil/uL (ref 3.87–5.11)
RDW: 15 % (ref 11.5–15.5)
WBC: 6.5 K/uL (ref 4.0–10.5)

## 2024-10-23 LAB — COMPREHENSIVE METABOLIC PANEL WITH GFR
ALT: 18 U/L (ref 0–35)
AST: 19 U/L (ref 0–37)
Albumin: 4 g/dL (ref 3.5–5.2)
Alkaline Phosphatase: 76 U/L (ref 39–117)
BUN: 11 mg/dL (ref 6–23)
CO2: 30 meq/L (ref 19–32)
Calcium: 9.1 mg/dL (ref 8.4–10.5)
Chloride: 103 meq/L (ref 96–112)
Creatinine, Ser: 0.89 mg/dL (ref 0.40–1.20)
GFR: 71.97 mL/min (ref >60.00)
Glucose, Bld: 100 mg/dL — ABNORMAL HIGH (ref 70–99)
Potassium: 4.2 meq/L (ref 3.5–5.1)
Sodium: 138 meq/L (ref 135–145)
Total Bilirubin: 0.6 mg/dL (ref 0.2–1.2)
Total Protein: 7.7 g/dL (ref 6.0–8.3)

## 2024-10-23 LAB — LIPID PANEL
Cholesterol: 165 mg/dL (ref 0–200)
HDL: 39.9 mg/dL (ref >39.00)
LDL Cholesterol: 111 mg/dL — ABNORMAL HIGH (ref 0–99)
NonHDL: 125.45
Total CHOL/HDL Ratio: 4
Triglycerides: 71 mg/dL (ref 0.0–149.0)
VLDL: 14.2 mg/dL (ref 0.0–40.0)

## 2024-10-23 LAB — HEMOGLOBIN A1C: Hgb A1c MFr Bld: 6 % (ref 4.6–6.5)

## 2024-10-23 LAB — TSH: TSH: 3.37 u[IU]/mL (ref 0.35–5.50)

## 2024-10-23 NOTE — Progress Notes (Signed)
 Subjective:  Patient ID: Tracie Joyce, female    DOB: [birth year]  Age: 58 y.o. MRN: [id number]  Patient Care Team: Corwin Antu, FNP as PCP - General (Family Medicine)   CC:  Chief Complaint  Patient presents with   Annual Exam    HPI Tracie Joyce is a 58 y.o. female who presents today for an annual physical exam. She reports consuming a general diet. Gym/ health club routine includes water aerobics, goal is 3 days a week. She generally feels well. She reports sleeping well. She does not have additional problems to discuss today.   Vision:Within last year Dental:Receives regular dental care  Mammogram: 04/03/24 Last pap: Dr. Perri gynecologist [facility] , 2025 negative per pt Colonoscopy: never had  Pt is without acute concerns.   Advanced Directives Patient does not have advanced directives   DEPRESSION SCREENING    10/23/2024    8:23 AM 05/02/2022   10:47 AM  PHQ 2/9 Scores  PHQ - 2 Score 0 0  PHQ- 9 Score 0      ROS: Negative unless specifically indicated above in HPI.    Current Outpatient Medications:    cycloSPORINE, PF, (CEQUA) 0.09 % SOLN, Cequa 0.09 % eye drops in a dropperette  INSTILL 1 DROP INTO EACH EYE INTO EACH EYE TWICE DAILY, Disp: , Rfl:    levonorgestrel (MIRENA) 20 MCG/DAY IUD, Mirena 20 mcg/24 hours (7 yrs) 52 mg intrauterine device, Disp: , Rfl:    Multiple Vitamin (MULTI-DAY PO), Take by mouth., Disp: , Rfl:    Turmeric-Tracie 135-6 MG CHEW, Chew by mouth., Disp: , Rfl:     Objective:    BP 136/86 (BP Location: Left Arm, Patient Position: Sitting, Cuff Size: Large)   Pulse 94   Temp 98.2 F (36.8 C) (Temporal)   Ht 5' 2.5 (1.588 m)   Wt 274 lb 3.2 oz (124.4 kg)   SpO2 98%   BMI 49.35 kg/m   BP Readings from Last 3 Encounters:  10/23/24 136/86  05/02/22 118/76  03/16/11 122/80      Physical Exam Vitals reviewed.  Constitutional:      General: She is not in acute distress.    Appearance: Normal appearance. She is  obese. She is not ill-appearing.  HENT:     Head: Normocephalic.     Right Ear: Tympanic membrane normal.     Left Ear: Tympanic membrane normal.     Nose: Nose normal.     Mouth/Throat:     Mouth: Mucous membranes are moist.  Eyes:     Extraocular Movements: Extraocular movements intact.     Pupils: Pupils are equal, round, and reactive to light.  Cardiovascular:     Rate and Rhythm: Normal rate and regular rhythm.  Pulmonary:     Effort: Pulmonary effort is normal.     Breath sounds: Normal breath sounds.  Musculoskeletal:        General: Normal range of motion.     Cervical back: Normal range of motion.  Skin:    General: Skin is warm.     Capillary Refill: Capillary refill takes less than 2 seconds.  Neurological:     General: No focal deficit present.     Mental Status: She is alert.  Psychiatric:        Mood and Affect: Mood normal.        Behavior: Behavior normal.        Thought Content: Thought content normal.  Judgment: Judgment normal.       Results       Assessment & Plan:   Assessment and Plan Assessment & Plan Adult Wellness Visit Routine adult wellness visit with no acute issues. She has had a recent mammogram and Pap smear, but it is unclear if she is up to date. No recent illnesses. Regular exercise routine. No smoking history. Menopausal status confirmed. No recent tetanus vaccination, but believes it is up to date. No advanced directive or living will on file. - Ordered routine labs including cholesterol, A1c, anemia, liver and kidney function, and thyroid function - Discussed shingles vaccine, recommended after age 51, with potential side effects of feeling achy the next day - Discussed pneumonia vaccine, now recommended for age 75 and up - Encouraged obtaining an advanced directive or living will and submitting a copy to the chart -Patient Counseling(The following topics were reviewed):  Preventative care handout given to pt  Health  maintenance and immunizations reviewed. Please refer to Health maintenance section. Pt advised on safe sex, wearing seatbelts in car, and proper nutrition labwork ordered today for annual Dental health: Discussed importance of regular tooth brushing, flossing, and dental visits.  Obesity, class 3 Class 3 obesity. Engages in regular physical activity including gym workouts and water aerobics. - Continue regular exercise routine  Screening for malignant neoplasm of colon Due for colonoscopy screening. No prior colonoscopy or Cologuard test. Needs referral for colonoscopy. - Sent referral for colonoscopy to Dr. Kristie in [place] - Advised to follow up if no contact is made within two weeks  Screening for lipoid disorders - Ordered cholesterol screening as part of routine labs          Follow-up: Return in about 1 year (around 10/23/2025) for f/u CPE.   Tracie Patrick, FNP
